# Patient Record
Sex: Male | Born: 1990 | Race: White | Hispanic: No | Marital: Single | Smoking: Never smoker
Health system: Southern US, Community
[De-identification: ages and names within clinical notes are randomized; demographics above are authoritative.]

## PROBLEM LIST (undated history)

## (undated) DIAGNOSIS — J302 Other seasonal allergic rhinitis: Secondary | ICD-10-CM

## (undated) DIAGNOSIS — F419 Anxiety disorder, unspecified: Secondary | ICD-10-CM

## (undated) HISTORY — DX: Anxiety disorder, unspecified: F41.9

---

## 2014-05-14 ENCOUNTER — Emergency Department (HOSPITAL_COMMUNITY): Payer: Worker's Compensation

## 2014-05-14 ENCOUNTER — Encounter (HOSPITAL_COMMUNITY): Payer: Self-pay | Admitting: Emergency Medicine

## 2014-05-14 ENCOUNTER — Emergency Department (HOSPITAL_COMMUNITY)
Admission: EM | Admit: 2014-05-14 | Discharge: 2014-05-14 | Disposition: A | Discharge status: Home / Self Care | Payer: Worker's Compensation | Attending: Emergency Medicine | Admitting: Emergency Medicine

## 2014-05-14 DIAGNOSIS — S82891A Other fracture of right lower leg, initial encounter for closed fracture: Secondary | ICD-10-CM

## 2014-05-14 DIAGNOSIS — Y9289 Other specified places as the place of occurrence of the external cause: Secondary | ICD-10-CM | POA: Insufficient documentation

## 2014-05-14 DIAGNOSIS — Z79899 Other long term (current) drug therapy: Secondary | ICD-10-CM | POA: Insufficient documentation

## 2014-05-14 DIAGNOSIS — Y9389 Activity, other specified: Secondary | ICD-10-CM | POA: Insufficient documentation

## 2014-05-14 DIAGNOSIS — S8263XA Displaced fracture of lateral malleolus of unspecified fibula, initial encounter for closed fracture: Secondary | ICD-10-CM | POA: Insufficient documentation

## 2014-05-14 MED ORDER — IBUPROFEN 200 MG PO TABS: 400.0000 mg | ORAL_TABLET | Freq: Four times a day (QID) | ORAL | Status: DC | PRN

## 2014-05-14 NOTE — ED Notes (Signed)
Pt was stepping out of truck, stepped onto R foot and twisted his ankle to the right. Pt complains of pain to lateral and anterior aspects of R ankle. Swelling and redness noted to site. Good dorsalis pedis pulse, good cap refill. Pt able to move all toes.

## 2014-05-14 NOTE — ED Provider Notes (Signed)
Medical screening examination/treatment/procedure(s) were performed by non-physician practitioner and as supervising physician I was immediately available for consultation/collaboration.  Nidya Bouyer L Steffani Dionisio, MD 05/14/14 2340 

## 2014-05-14 NOTE — ED Provider Notes (Signed)
CSN: [id number]     Arrival date & time 05/14/14  2014 History  This chart was scribed for non-physician practitioner, Earley Favor, NP working with Flint Melter, MD, by Jarvis Morgan, ED Scribe. This patient was seen in room WTR8/WTR8 and the patient's care was started at 8:44 PM.      Chief Complaint  Patient presents with  . Ankle Pain     The history is provided by the patient. No language interpreter was used.   HPI Comments: Omar Marquez is a 23 y.o. male who presents to the Emergency Department complaining of constant right ankle pain. He states he was stepping out of truck and twisted his ankle to the right. Pt states he is having associated swelling and redness of the right ankle. He states he applied ice to the area with relief. States that the pain initially was "9/10" but after applying ice compress it is now a "2/10". Has not taken any medication for the pain. He denies any numbness or weakness.      History reviewed. No pertinent past medical history. History reviewed. No pertinent past surgical history. No family history on file. History  Substance Use Topics  . Smoking status: Never Smoker   . Smokeless tobacco: Not on file  . Alcohol Use: 1.8 oz/week    3 Cans of beer per week    Review of Systems  Musculoskeletal: Positive for arthralgias (right ankle pain) and joint swelling (right ankle swelling).  Skin: Positive for color change (redness to right ankle). Negative for wound.  Neurological: Negative for weakness and numbness.  All other systems reviewed and are negative.     Allergies  Review of patient's allergies indicates no known allergies.  Home Medications   Prior to Admission medications   Medication Sig Start Date End Date Taking? Authorizing Provider  diphenhydrAMINE (BENADRYL) 25 MG tablet Take 50 mg by mouth every 6 (six) hours as needed for allergies or sleep.   Yes Historical Provider, MD  ibuprofen (ADVIL,MOTRIN) 200 MG tablet Take 2  tablets (400 mg total) by mouth every 6 (six) hours as needed for moderate pain. 05/14/14   Arman Filter, NP   Triage Vitals: BP 125/80  Pulse 91  Temp(Src) 98.4 F (36.9 C) (Oral)  Resp 18  Ht 6\' 1"  (1.854 m)  Wt 320 lb (145.151 kg)  BMI 42.23 kg/m2  SpO2 100%  Physical Exam  Nursing note and vitals reviewed. Constitutional: He is oriented to person, place, and time. He appears well-developed and well-nourished. No distress.  HENT:  Head: Normocephalic and atraumatic.  Eyes: Conjunctivae and EOM are normal.  Neck: Neck supple. No tracheal deviation present.  Cardiovascular: Normal rate.   Pulses:      Dorsalis pedis pulses are 2+ on the right side, and 2+ on the left side.  Pulmonary/Chest: Effort normal. No respiratory distress.  Musculoskeletal: Normal range of motion. He exhibits tenderness. He exhibits no edema.  Swelling of lateral malleous, good ROM of his toes, brisk cap refill  Neurological: He is alert and oriented to person, place, and time.  Skin: Skin is warm, dry and intact.  Psychiatric: He has a normal mood and affect. His behavior is normal.    ED Course  Procedures (including critical care time)  DIAGNOSTIC STUDIES: Oxygen Saturation is 100% on RA, normal by my interpretation.    COORDINATION OF CARE: 8:52 PM- Will order diagnostic imaging of right ankle. Pt advised of plan for treatment and pt agrees.  Labs Review Labs Reviewed - No data to display  Imaging Review Dg Ankle Complete Right  05/14/2014   CLINICAL DATA:  Lateral ankle pain, twisting injury  EXAM: RIGHT ANKLE - COMPLETE 3+ VIEW  COMPARISON:  None.  FINDINGS: Tiny cortical avulsion fracture along the inferior aspect of the lateral malleolus.  The ankle mortise is intact.  The base of the fifth metatarsal is unremarkable.  Mild lateral soft tissue swelling.  IMPRESSION: Tiny cortical avulsion fracture along the inferior aspect of the lateral malleolus.  Mild lateral soft tissue swelling.    Electronically Signed   By: Charline BillsSriyesh  Krishnan M.D.   On: 05/14/2014 21:30     EKG Interpretation None      MDM  Small avulsion fracture of the distal fibula  Placed in [facility] Walker and referred to ortho for FU  Final diagnoses:  Ankle fracture, right, closed, initial encounter   I personally performed the services described in this documentation, which was scribed in my presence. The recorded information has been reviewed and is accurate.     Arman FilterGail K Jayln Branscom, NP 05/14/14 2205

## 2014-05-14 NOTE — Discharge Instructions (Signed)
Ankle Fracture A fracture is a break in a bone. A cast or splint may be used to protect the ankle and heal the break. Sometimes, surgery is needed. HOME CARE  Use crutches as told by your doctor. It is very important that you use your crutches correctly.  Do not put weight or pressure on the injured ankle until told by your doctor.  Keep your ankle raised (elevated) when sitting or lying down.  Apply ice to the ankle:  Put ice in a plastic bag.  Place a towel between your cast and the bag.  Leave the ice on for 20 minutes, 2-3 times a day.  If you have a plaster or fiberglass cast:  Do not try to scratch under the cast with any objects.  Check the skin around the cast every day. You may put lotion on red or sore areas.  Keep your cast dry and clean.  If you have a plaster splint:  Wear the splint as told by your doctor.  You can loosen the elastic around the splint if your toes get numb, tingle, or turn cold or blue.  Do not put pressure on any part of your cast or splint. It may break. Rest your plaster splint or cast only on a pillow the first 24 hours until it is fully hardened.  Cover your cast or splint with a plastic bag during showers.  Do not lower your cast or splint into water.  Take medicine as told by your doctor.  Do not drive until your doctor says it is safe.  Follow-up with your doctor as told. It is very important that you go to your follow-up visits. GET HELP IF: The swelling and discomfort gets worse.  GET HELP RIGHT AWAY IF:   Your splint or cast breaks.  You continue to have very bad pain.  You have new pain or swelling after your splint or cast was put on.  Your skin or toes below the injured ankle:  Turn blue or gray.  Feel cold, numb, or you cannot feel them.  There is a bad smell or yellowish white fluid (pus) coming from under the splint or cast. MAKE SURE YOU:   Understand these instructions.  Will watch your  condition.  Will get help right away if you are not doing well or get worse. Document Released: 07/21/2009 Document Revised: 07/14/2013 Document Reviewed: 04/22/2013 [facility] Patient Information 2015 [employer] [place], [place]. This information is not intended to replace advice given to you by your health care provider. Make sure you discuss any questions you have with your health care provider. As shown you have a tiny avulsion fracture of the distal fibula  And have been placed in a walking cast  Please call Dr. Roda ShuttersXu to schedule an appointment for follow up care to monitor the healing

## 2015-10-05 IMAGING — CR DG ANKLE COMPLETE 3+V*R*
3 series · 3 of 3 positions shown · non-contrast
Comparison: None.

CLINICAL DATA: Lateral ankle pain, twisting injury

EXAM:
RIGHT ANKLE - COMPLETE 3+ VIEW

[x ankle ap right]
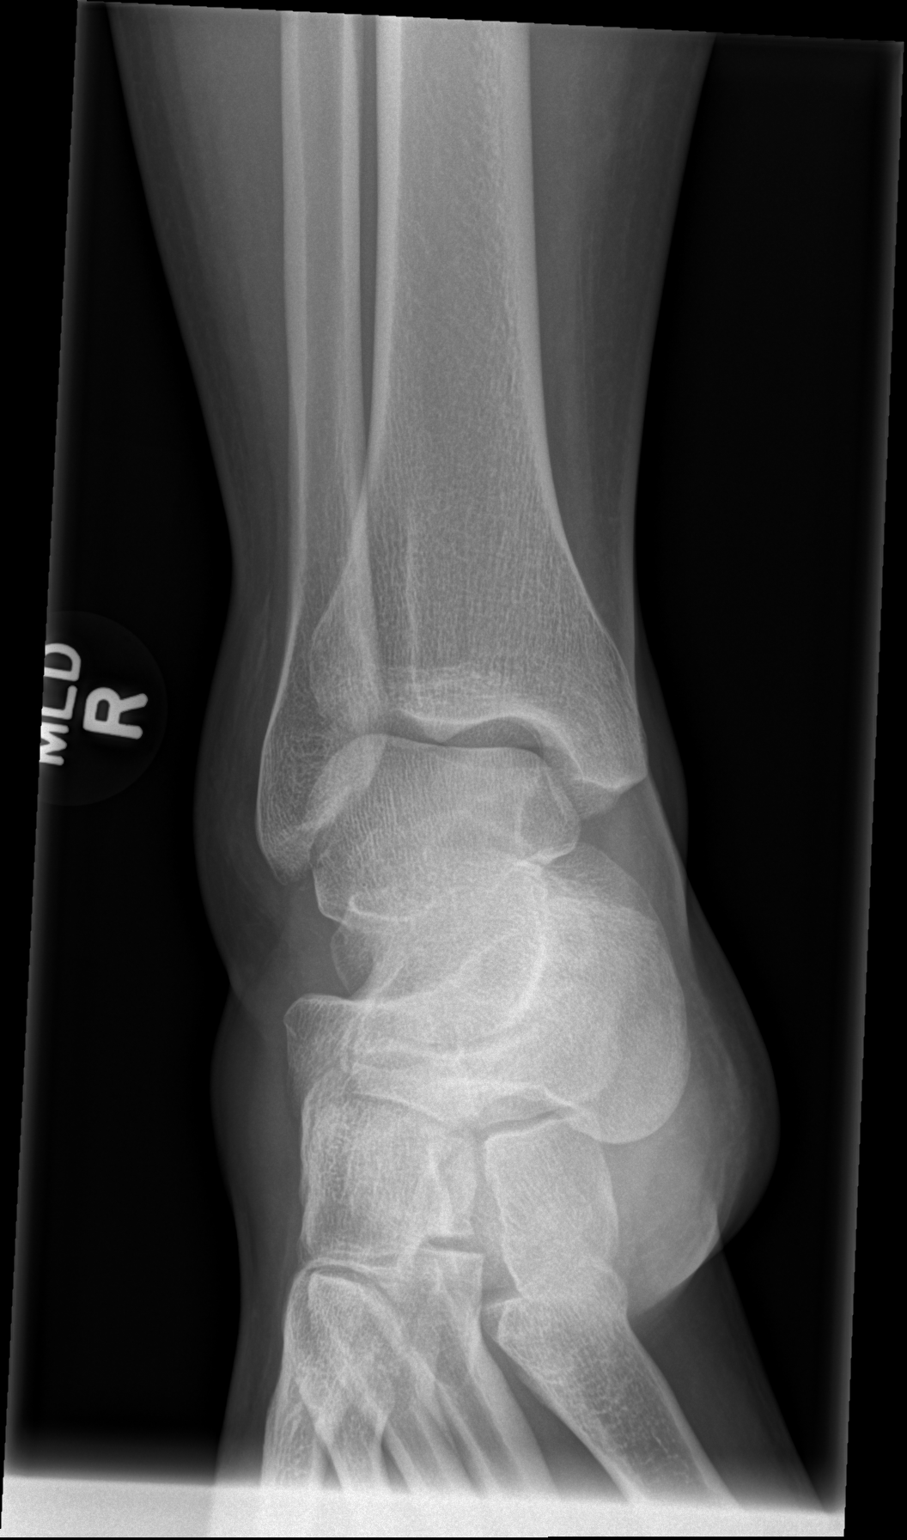

[x ankle obl right]
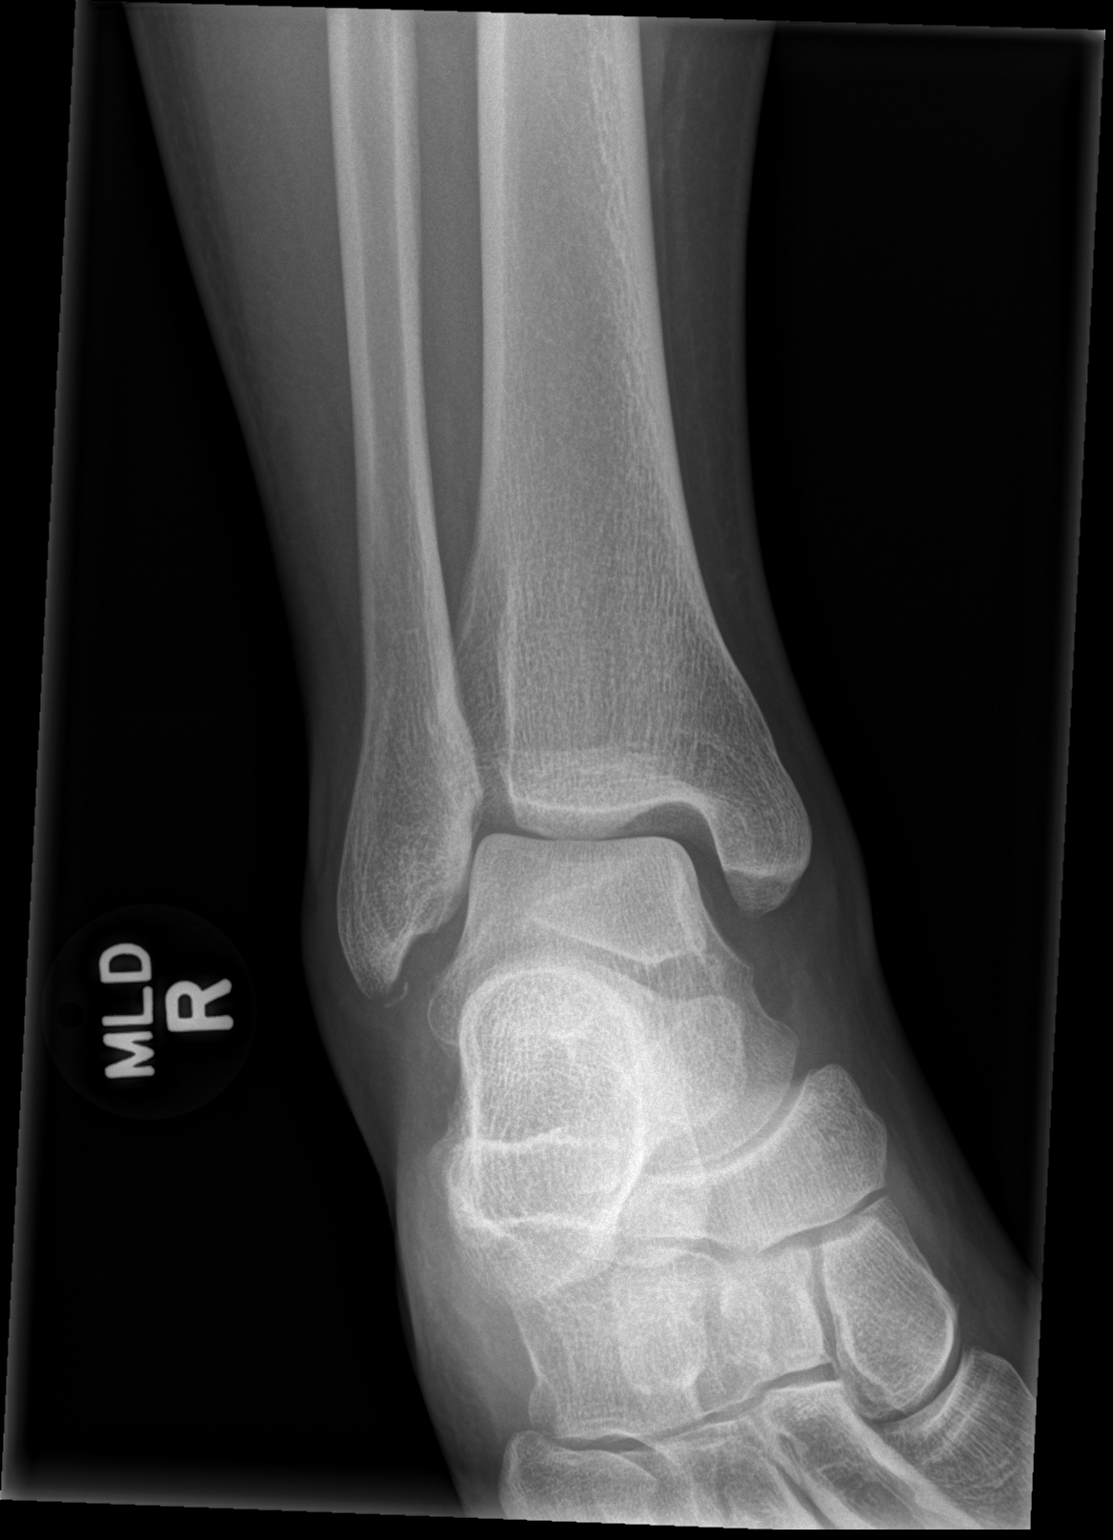

[x ankle lat right]
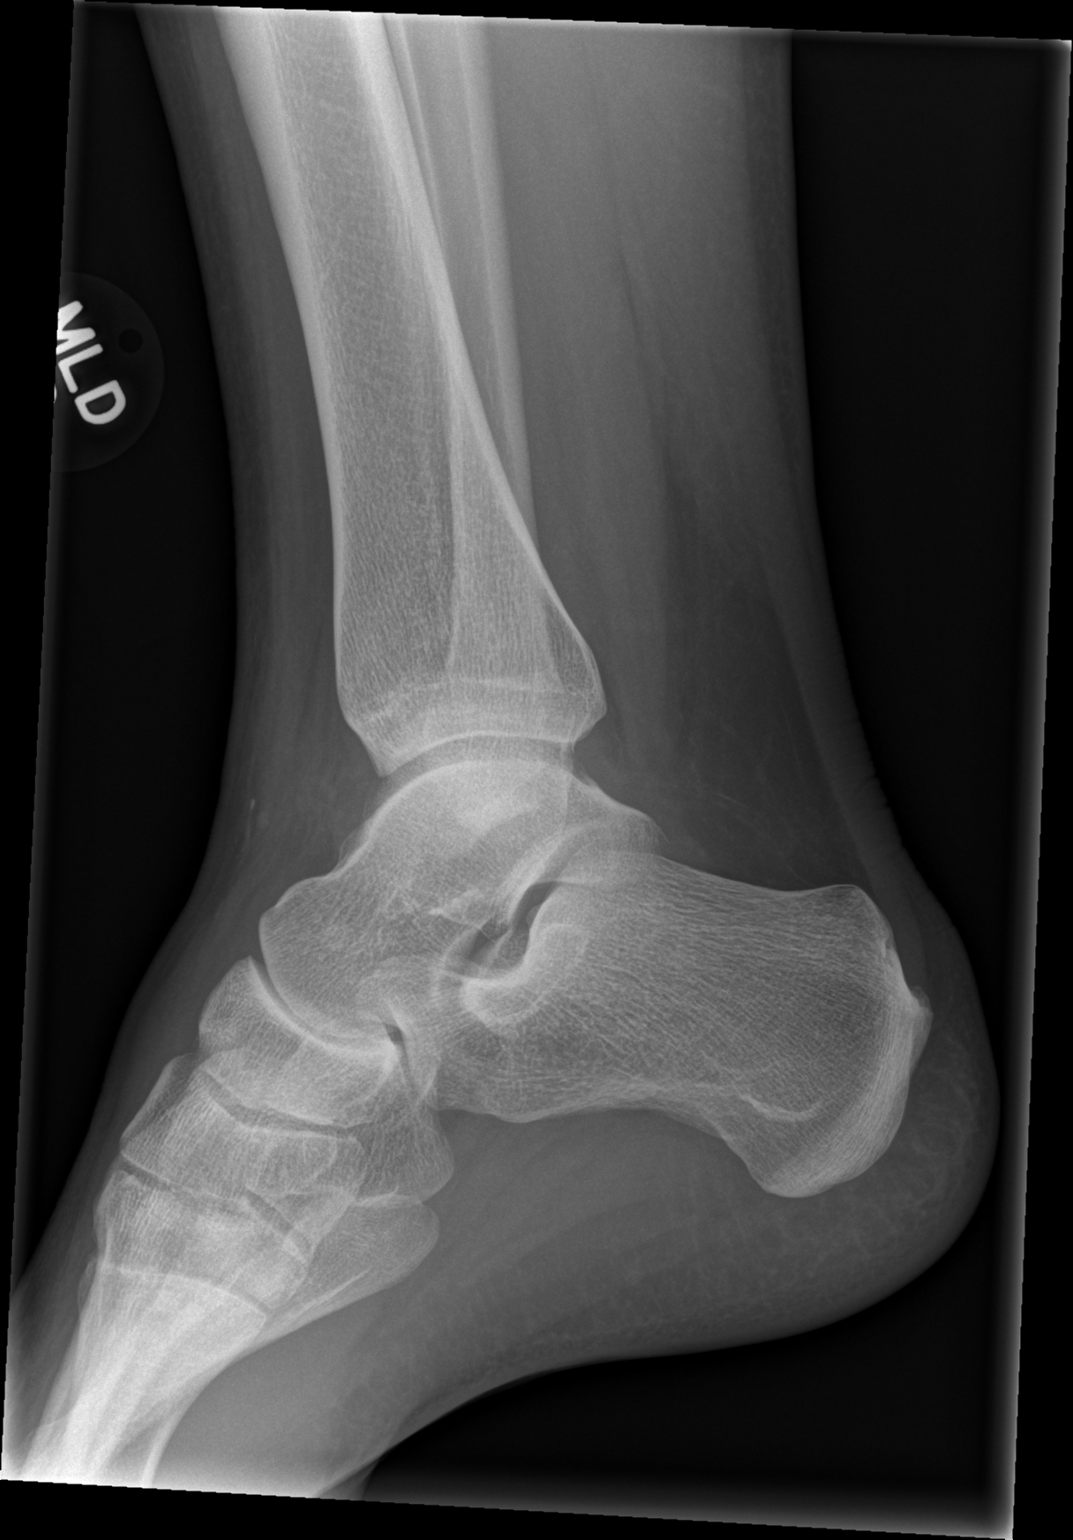

[3 of 3 positions shown; findings below may reference images not displayed]

FINDINGS: Tiny cortical avulsion fracture along the inferior aspect of the
lateral malleolus.

The ankle mortise is intact.

The base of the fifth metatarsal is unremarkable.

Mild lateral soft tissue swelling.
IMPRESSION: Tiny cortical avulsion fracture along the inferior aspect of the
lateral malleolus.

Mild lateral soft tissue swelling.

## 2022-01-31 ENCOUNTER — Emergency Department
Admission: RE | Admit: 2022-01-31 | Discharge: 2022-01-31 | Disposition: A | Discharge status: Home / Self Care | Payer: Self-pay | Source: Ambulatory Visit

## 2022-01-31 VITALS — BP 135/90 | HR 101 | Temp 99.0°F | Resp 20 | Ht 72.0 in | Wt 220.0 lb

## 2022-01-31 DIAGNOSIS — L0501 Pilonidal cyst with abscess: Secondary | ICD-10-CM

## 2022-01-31 HISTORY — DX: Other seasonal allergic rhinitis: J30.2

## 2022-01-31 MED ORDER — SULFAMETHOXAZOLE-TRIMETHOPRIM 800-160 MG PO TABS: 1.0000 | ORAL_TABLET | Freq: Two times a day (BID) | ORAL | 0 refills | Status: AC

## 2022-01-31 NOTE — ED Provider Notes (Signed)
?[facility] ? ? ? ?CSN: [id number] ?Arrival date & time: 01/31/22  1636 ? ? ?  ? ?History   ?Chief Complaint ?Chief Complaint  ?Patient presents with  ? Abscess  ? ? ?HPI ?Omar Marquez is a 31 y.o. male.  ? ?HPI 31 year old male presents with abscess of back of neck.  Patient reports lump is been there for several years but has become an inflamed and painful to the touch over the past several days.  PMH significant for seasonal allergies. ? ?Past Medical History:  ?Diagnosis Date  ? Seasonal allergies   ? ? ?There are no problems to display for this patient. ? ? ?History reviewed. No pertinent surgical history. ? ? ? ? ?Home Medications   ? ?Prior to Admission medications   ?Medication Sig Start Date End Date Taking? Authorizing Provider  ?cetirizine (ZYRTEC) 10 MG tablet Take 10 mg by mouth daily.   Yes [provider]  ?sulfamethoxazole-trimethoprim (BACTRIM DS) 800-160 MG tablet Take 1 tablet by mouth 2 (two) times daily for 10 days. 01/31/22 02/10/22 Yes Trevor Iha, FNP  ?diphenhydrAMINE (BENADRYL) 25 MG tablet Take 50 mg by mouth every 6 (six) hours as needed for allergies or sleep.    [provider]  ?ibuprofen (ADVIL,MOTRIN) 200 MG tablet Take 2 tablets (400 mg total) by mouth every 6 (six) hours as needed for moderate pain. 05/14/14   Earley Favor, NP  ? ? ?Family History ?Family History  ?Problem Relation Age of Onset  ? Hypertension Mother   ? Diabetes Mother   ? Hypertension Father   ? Diabetes Father   ? ? ?Social History ?Social History  ? ?Tobacco Use  ? Smoking status: Never  ? Smokeless tobacco: Never  ?Vaping Use  ? Vaping Use: Never used  ?Substance Use Topics  ? Alcohol use: Not Currently  ? Drug use: Yes  ?  Frequency: 5.0 times per week  ?  Types: Marijuana  ?  Comment: weekly  ? ? ? ?Allergies   ?Patient has no known allergies. ? ? ?Review of Systems ?Review of Systems  ?Skin:   ?     Cyst of back of neck x3 years  ?All other systems reviewed and are  negative. ? ? ?Physical Exam ?Triage Vital Signs ?ED Triage Vitals  ?Enc Vitals Group  ?   BP 01/31/22 1656 (!) 150/100  ?   Pulse Rate 01/31/22 1656 (!) 101  ?   Resp 01/31/22 1656 20  ?   Temp 01/31/22 1656 99 ?F (37.2 ?C)  ?   Temp Source 01/31/22 1656 Oral  ?   SpO2 01/31/22 1656 98 %  ?   Weight 01/31/22 1652 220 lb (99.8 kg)  ?   Height 01/31/22 1652 6' (1.829 m)  ?   Head Circumference --   ?   Peak Flow --   ?   Pain Score 01/31/22 1650 2  ?   Pain Loc --   ?   Pain Edu? --   ?   Excl. in GC? --   ? ?No data found. ? ?Updated Vital Signs ?BP 135/90 (BP Location: Right Arm)   Pulse (!) 101   Temp 99 ?F (37.2 ?C) (Oral)   Resp 20   Ht 6' (1.829 m)   Wt 220 lb (99.8 kg)   SpO2 98%   BMI 29.84 kg/m?  ? ?Physical Exam ?Vitals and nursing note reviewed.  ?Constitutional:   ?   Appearance: Normal appearance. He is  normal weight.  ?HENT:  ?   Head: Normocephalic and atraumatic.  ?   Mouth/Throat:  ?   Mouth: Mucous membranes are moist.  ?   Pharynx: Oropharynx is clear.  ?Eyes:  ?   Extraocular Movements: Extraocular movements intact.  ?   Conjunctiva/sclera: Conjunctivae normal.  ?   Pupils: Pupils are equal, round, and reactive to light.  ?Cardiovascular:  ?   Rate and Rhythm: Normal rate and regular rhythm.  ?   Pulses: Normal pulses.  ?   Heart sounds: Normal heart sounds. No murmur heard. ?Pulmonary:  ?   Effort: Pulmonary effort is normal.  ?   Breath sounds: Normal breath sounds. No wheezing, rhonchi or rales.  ?Musculoskeletal:  ?   Cervical back: Normal range of motion and neck supple.  ?Skin: ?   General: Skin is warm and dry.  ?   Comments: Posterior neck (right-sided inferior aspect):~3.5 cm x 3.5 cm with scant mucopurulent  ?Neurological:  ?   General: No focal deficit present.  ?   Mental Status: He is alert and oriented to person, place, and time. Mental status is at baseline.  ? ? ? ?UC Treatments / Results  ?Labs ?(all labs ordered are listed, but only abnormal results are displayed) ?Labs  Reviewed - No data to display ? ?EKG ? ? ?Radiology ?No results found. ? ?Procedures ?Incision and Drainage ? ?Date/Time: 01/31/2022 5:37 PM ?Performed by: Trevor Iha, FNP ?Authorized by: Trevor Iha, FNP  ? ?Consent:  ?  Consent obtained:  Verbal ?  Consent given by:  Patient ?  Risks, benefits, and alternatives were discussed: yes   ?Universal protocol:  ?  Procedure explained and questions answered to patient or proxy's satisfaction: yes   ?  Patient identity confirmed:  Verbally with patient, hospital-assigned identification number and arm band ?Location:  ?  Type:  Pilonidal cyst ?  Size:  3.5 x 3.5 cm ?  Location: Posterior neck. ?Pre-procedure details:  ?  Skin preparation:  Povidone-iodine ?Sedation:  ?  Sedation type:  None ?Anesthesia:  ?  Anesthesia method:  Local infiltration ?  Local anesthetic:  Lidocaine 2% w/o epi ?Procedure type:  ?  Complexity:  Simple ?Procedure details:  ?  Incision types:  Single straight ?  Incision depth:  Dermal ?  Drainage:  Serosanguinous ?  Drainage amount:  Moderate ?  Packing materials:  None ?Post-procedure details:  ?  Procedure completion:  Tolerated well, no immediate complications (including critical care time) ? ?Medications Ordered in UC ?Medications - No data to display ? ?Initial Impression / Assessment and Plan / UC Course  ?I have reviewed the triage vital signs and the nursing notes. ? ?Pertinent labs & imaging results that were available during my care of the patient were reviewed by me and considered in my medical decision making (see chart for details). ? ?  ? ?MDM: 1.  Pilonidal abscess-Rx'd Bactrim, please see procedural note. Advised/instructed patient to take medication as directed with food to completion.  Encouraged patient to increase daily water intake while taking this medication.  Advised patient if symptoms worsen and/or unresolved please follow-up with PCP or here for further evaluation.  Patient discharged home, hemodynamically  stable. ?Final Clinical Impressions(s) / UC Diagnoses  ? ?Final diagnoses:  ?Pilonidal abscess  ? ? ? ?Discharge Instructions   ? ?  ?Advised/instructed patient to take medication as directed with food to completion.  Encouraged patient to increase daily water intake while taking this medication.  Advised patient if symptoms worsen and/or unresolved please follow-up with PCP or here for further evaluation. ? ? ? ? ?ED Prescriptions   ? ? Medication Sig Dispense Auth. Provider  ? sulfamethoxazole-trimethoprim (BACTRIM DS) 800-160 MG tablet Take 1 tablet by mouth 2 (two) times daily for 10 days. 20 tablet Trevor Ihaagan, Omar Carmean, FNP  ? ?  ? ?PDMP not reviewed this encounter. ?  ?Trevor IhaRagan, Rylynn Kobs, FNP ?01/31/22 1742 ? ?

## 2022-01-31 NOTE — ED Triage Notes (Signed)
Pt presents to Urgent Care with c/o abscess to back of neck. Reports "lump" has been there for several years, but it has become inflamed and painful to the touch over the past several days.  ?

## 2022-01-31 NOTE — Discharge Instructions (Addendum)
Advised/instructed patient to take medication as directed with food to completion.  Encouraged patient to increase daily water intake while taking this medication.  Advised patient if symptoms worsen and/or unresolved please follow-up with PCP or here for further evaluation. ?

## 2022-02-11 ENCOUNTER — Emergency Department
Admission: RE | Admit: 2022-02-11 | Discharge: 2022-02-11 | Disposition: A | Discharge status: Home / Self Care | Payer: Self-pay | Source: Ambulatory Visit

## 2022-02-11 VITALS — BP 121/81 | HR 91 | Temp 98.3°F | Resp 18 | Ht 72.0 in | Wt 220.0 lb

## 2022-02-11 DIAGNOSIS — L089 Local infection of the skin and subcutaneous tissue, unspecified: Secondary | ICD-10-CM

## 2022-02-11 DIAGNOSIS — L0291 Cutaneous abscess, unspecified: Secondary | ICD-10-CM

## 2022-02-11 MED ORDER — AMOXICILLIN-POT CLAVULANATE 875-125 MG PO TABS: 1.0000 | ORAL_TABLET | Freq: Two times a day (BID) | ORAL | 0 refills | Status: DC

## 2022-02-11 NOTE — Discharge Instructions (Signed)
Put warm compresses on area for 20 min 2-3 times a day ?Take antibiotic as directed ?May change bandage daily, but try to protect gauze wick ?Come back in 3 days for re check ?Ibuprofen for pain ?

## 2022-02-11 NOTE — ED Triage Notes (Signed)
Patient c/o recurrent abscess at the base of his neck x 3-4 days ago.  The abscess was originally opened and drained 10 days ago.  Patient completed all medication given. ?

## 2022-02-14 ENCOUNTER — Emergency Department (INDEPENDENT_AMBULATORY_CARE_PROVIDER_SITE_OTHER)
Admission: RE | Admit: 2022-02-14 | Discharge: 2022-02-14 | Disposition: A | Discharge status: Home / Self Care | Payer: Self-pay | Source: Ambulatory Visit

## 2022-02-14 VITALS — BP 124/85 | HR 83 | Temp 99.1°F | Resp 17 | Wt 220.0 lb

## 2022-02-14 DIAGNOSIS — Z5189 Encounter for other specified aftercare: Secondary | ICD-10-CM

## 2022-02-14 NOTE — ED Provider Notes (Signed)
?[facility] ? ? ? ?CSN: [id number] ?Arrival date & time: 02/14/22  1543 ? ? ?  ? ?History   ?Chief Complaint ?Chief Complaint  ?Patient presents with  ? Wound Check  ? ? ?HPI ?Omar Marquez is a 31 y.o. male.  ? ?HPI 31 year old male presents for follow-up of infected epithelial inclusion cyst from Monday, 02/11/2022 office visit.  Patient reports packing was placed in affected inclusion cyst area of her neck on 02/11/2022.  No documentation can be found about packing this area, although can be seen is new prescription for antibiotic Augmentin. ? ?Past Medical History:  ?Diagnosis Date  ? Seasonal allergies   ? ? ?There are no problems to display for this patient. ? ? ?History reviewed. No pertinent surgical history. ? ? ? ? ?Home Medications   ? ?Prior to Admission medications   ?Medication Sig Start Date End Date Taking? Authorizing Provider  ?amoxicillin-clavulanate (AUGMENTIN) 875-125 MG tablet Take 1 tablet by mouth every 12 (twelve) hours. 02/11/22  Yes Eustace Moore, MD  ?cetirizine (ZYRTEC) 10 MG tablet Take 10 mg by mouth daily.    [provider]  ?diphenhydrAMINE (BENADRYL) 25 MG tablet Take 50 mg by mouth every 6 (six) hours as needed for allergies or sleep.    [provider]  ?ibuprofen (ADVIL,MOTRIN) 200 MG tablet Take 2 tablets (400 mg total) by mouth every 6 (six) hours as needed for moderate pain. 05/14/14   Earley Favor, NP  ? ? ?Family History ?Family History  ?Problem Relation Age of Onset  ? Hypertension Mother   ? Diabetes Mother   ? Hypertension Father   ? Diabetes Father   ? ? ?Social History ?Social History  ? ?Tobacco Use  ? Smoking status: Never  ? Smokeless tobacco: Never  ?Vaping Use  ? Vaping Use: Never used  ?Substance Use Topics  ? Alcohol use: Not Currently  ? Drug use: Yes  ?  Frequency: 5.0 times per week  ?  Types: Marijuana  ?  Comment: weekly  ? ? ? ?Allergies   ?Patient has no known allergies. ? ? ?Review of Systems ?Review of Systems  ?Skin:   ?      S/P infected epithelial inclusion cyst follow-up  ? ? ?Physical Exam ?Triage Vital Signs ?ED Triage Vitals  ?Enc Vitals Group  ?   BP   ?   Pulse   ?   Resp   ?   Temp   ?   Temp src   ?   SpO2   ?   Weight   ?   Height   ?   Head Circumference   ?   Peak Flow   ?   Pain Score   ?   Pain Loc   ?   Pain Edu?   ?   Excl. in GC?   ? ?No data found. ? ?Updated Vital Signs ?BP 124/85 (BP Location: Left Arm)   Pulse 83   Temp 99.1 ?F (37.3 ?C) (Oral)   Resp 17   Wt 220 lb (99.8 kg)   SpO2 100%   BMI 29.84 kg/m?  ? ?Physical Exam ?Vitals and nursing note reviewed.  ?Constitutional:   ?   Appearance: Normal appearance. He is normal weight.  ?HENT:  ?   Head: Normocephalic and atraumatic.  ?Eyes:  ?   Extraocular Movements: Extraocular movements intact.  ?   Conjunctiva/sclera: Conjunctivae normal.  ?   Pupils: Pupils are equal, round, and reactive  to light.  ?Cardiovascular:  ?   Rate and Rhythm: Normal rate and regular rhythm.  ?   Pulses: Normal pulses.  ?   Heart sounds: Normal heart sounds.  ?Pulmonary:  ?   Effort: Pulmonary effort is normal.  ?   Breath sounds: Normal breath sounds. No wheezing, rhonchi or rales.  ?Musculoskeletal:  ?   Cervical back: Normal range of motion and neck supple.  ?Skin: ?   General: Skin is warm and dry.  ?   Comments: Posterior neck (right-sided inferior aspect):~12.0-15.0 cm Iodofoam saturated with bloody mucopurulent discharge removed successfully, affected area is non-erythematous, non-indurated, non-fluctuant, no lymphatic streaking noted  ?Neurological:  ?   General: No focal deficit present.  ?   Mental Status: He is alert and oriented to person, place, and time.  ? ? ? ?UC Treatments / Results  ?Labs ?(all labs ordered are listed, but only abnormal results are displayed) ?Labs Reviewed - No data to display ? ?EKG ? ? ?Radiology ?No results found. ? ?Procedures ?Procedures (including critical care time) ? ?Medications Ordered in UC ?Medications - No data to display ? ?Initial  Impression / Assessment and Plan / UC Course  ?I have reviewed the triage vital signs and the nursing notes. ? ?Pertinent labs & imaging results that were available during my care of the patient were reviewed by me and considered in my medical decision making (see chart for details). ? ?  ? ?MDM: 1.  Wound check, abscess-Advised patient to leave area open to heal by secondary intention/allow scab formation.  Encouraged patient to not apply any topical antibiotics to this area and to complete previously prescribed Augmentin.  Patient discharged home, hemodynamically stable. ?Final Clinical Impressions(s) / UC Diagnoses  ? ?Final diagnoses:  ?Wound check, abscess  ? ? ? ?Discharge Instructions   ? ?  ?Advised patient to leave area open to heal by secondary intention/allow scab formation.  Encouraged patient to not apply any topical antibiotics to this area and to complete previously prescribed Augmentin. ? ? ? ? ?ED Prescriptions   ?None ?  ? ?PDMP not reviewed this encounter. ?  ?Trevor Iha, FNP ?02/14/22 1618 ? ?

## 2022-02-14 NOTE — ED Triage Notes (Signed)
Recheck of wound & removal of packing to right side of neck ?Packing in place  ?Denies fever ?Taking Advil 400mg  at 0830 ?Taking antibiotics as directed   ?No red ness at site - bandaid in place ?

## 2022-02-14 NOTE — Discharge Instructions (Addendum)
Advised patient to leave area open to heal by secondary intention/allow scab formation.  Encouraged patient to not apply any topical antibiotics to this area and to complete previously prescribed Augmentin. ?

## 2023-08-12 ENCOUNTER — Ambulatory Visit: Payer: Self-pay | Admitting: Family Medicine

## 2023-08-12 ENCOUNTER — Ambulatory Visit: Payer: Self-pay | Admitting: Urgent Care

## 2023-08-12 VITALS — BP 128/79 | HR 114 | Temp 98.1°F | Ht 71.0 in | Wt 233.0 lb

## 2023-08-12 DIAGNOSIS — Z23 Encounter for immunization: Secondary | ICD-10-CM | POA: Insufficient documentation

## 2023-08-12 DIAGNOSIS — R000 Tachycardia, unspecified: Secondary | ICD-10-CM | POA: Insufficient documentation

## 2023-08-12 DIAGNOSIS — F410 Panic disorder [episodic paroxysmal anxiety] without agoraphobia: Secondary | ICD-10-CM | POA: Insufficient documentation

## 2023-08-12 DIAGNOSIS — Z6832 Body mass index (BMI) 32.0-32.9, adult: Secondary | ICD-10-CM | POA: Insufficient documentation

## 2023-08-12 DIAGNOSIS — Z7689 Persons encountering health services in other specified circumstances: Secondary | ICD-10-CM | POA: Insufficient documentation

## 2023-08-12 MED ORDER — HYDROXYZINE PAMOATE 25 MG PO CAPS: 25.0000 mg | ORAL_CAPSULE | Freq: Three times a day (TID) | ORAL | 0 refills | Status: AC | PRN

## 2023-08-12 NOTE — Assessment & Plan Note (Addendum)
Unsure what is causing symptoms. Will get labs to rule out thyroid issues. He will try hydroxyzine 25 mg every 8 hours as needed for symptoms while waiting for lab results. Understands this can cause drowsiness and will not drive. Denies chest pain and palpitations.  Endorsees shortness of breath during episodes.

## 2023-08-12 NOTE — Assessment & Plan Note (Signed)
If thyroid function is normal, patient is interested in starting on medication for anxiety.  He is experiencing some stressful situation with his current employer.  Symptoms have occurred 3 times.  No thoughts of self-harm.  Will discuss behavioral health referral as needed.

## 2023-08-12 NOTE — Progress Notes (Signed)
New Patient Office Visit  Subjective    Patient ID: Omar Marquez, male    DOB: 26-Dec-1990  Age: 32 y.o. MRN: [id number]  CC:  Chief Complaint  Patient presents with   Establish Care    Mood anxiety 1 mo painic attack blurry vision cold clammy sweating, and jittery, sallow breathing.     HPI Omar Marquez presents to establish care with this practice. He is new to me.   Reports having blurred vision, cold clammy skin, sweating, and jitteriness, difficult getting warm, and labored breathing.  Occurred 3 times. First one month ago.  Tries deep breathing which helps a little. Talking to mom helps. Work is stressful right now. Thinking about his job is causing him to feel jittery now.  Diabetes runs in family. Mom tested his blood sugar and it was normal.  Unsure if anxiety is causing symptoms, will get labs today.      Outpatient Encounter Medications as of 08/12/2023  Medication Sig   hydrOXYzine (VISTARIL) 25 MG capsule Take 1 capsule (25 mg total) by mouth every 8 (eight) hours as needed.   ibuprofen (ADVIL,MOTRIN) 200 MG tablet Take 2 tablets (400 mg total) by mouth every 6 (six) hours as needed for moderate pain.   [DISCONTINUED] amoxicillin-clavulanate (AUGMENTIN) 875-125 MG tablet Take 1 tablet by mouth every 12 (twelve) hours. (Patient not taking: Reported on 08/12/2023)   [DISCONTINUED] cetirizine (ZYRTEC) 10 MG tablet Take 10 mg by mouth daily. (Patient not taking: Reported on 08/12/2023)   [DISCONTINUED] diphenhydrAMINE (BENADRYL) 25 MG tablet Take 50 mg by mouth every 6 (six) hours as needed for allergies or sleep. (Patient not taking: Reported on 08/12/2023)   No facility-administered encounter medications on file as of 08/12/2023.    Past Medical History:  Diagnosis Date   Anxiety A month ago   Establishing care with new doctor, encounter for 08/12/2023   Seasonal allergies     History reviewed. No pertinent surgical history.  Family History  Problem  Relation Age of Onset   Hypertension Mother    Diabetes Mother    Hypertension Father    Diabetes Father     Social History   Socioeconomic History   Marital status: Single    Spouse name: Not on file   Number of children: Not on file   Years of education: Not on file   Highest education level: Bachelor's degree (e.g., BA, AB, BS)  Occupational History   Not on file  Tobacco Use   Smoking status: Never   Smokeless tobacco: Never  Vaping Use   Vaping status: Never Used  Substance and Sexual Activity   Alcohol use: Not Currently   Drug use: Yes    Frequency: 5.0 times per week    Types: Marijuana    Comment: weekly   Sexual activity: Not Currently    Birth control/protection: None  Other Topics Concern   Not on file  Social History Narrative   Not on file   Social Determinants of Health   Financial Resource Strain: Low Risk  (08/12/2023)   Overall Financial Resource Strain (CARDIA)    Difficulty of Paying Living Expenses: Not very hard  Food Insecurity: No Food Insecurity (08/12/2023)   Hunger Vital Sign    Worried About Running Out of Food in the Last Year: Never true    Ran Out of Food in the Last Year: Never true  Transportation Needs: No Transportation Needs (08/12/2023)   PRAPARE - Transportation    Lack of Transportation (  Medical): No    Lack of Transportation (Non-Medical): No  Physical Activity: Insufficiently Active (08/12/2023)   Exercise Vital Sign    Days of Exercise per Week: 3 days    Minutes of Exercise per Session: 30 min  Stress: Stress Concern Present (08/12/2023)   [employer] of Occupational Health - Occupational Stress Questionnaire    Feeling of Stress : To some extent  Social Connections: Socially Isolated (08/12/2023)   Social Connection and Isolation Panel [NHANES]    Frequency of Communication with Friends and Family: More than three times a week    Frequency of Social Gatherings with Friends and Family: Three times a week    Attends  Religious Services: Never    Active Member of Clubs or Organizations: No    Attends Engineer, structural: Not on file    Marital Status: Never married  Intimate Partner Violence: Not on file    Review of Systems  Constitutional:  Positive for chills. Negative for fever and weight loss.  Respiratory:  Positive for shortness of breath (during episode).   Cardiovascular:  Negative for chest pain and palpitations.  Neurological:  Positive for dizziness.  Endo/Heme/Allergies:  Negative for polydipsia.  Psychiatric/Behavioral:  Negative for depression and suicidal ideas.         Objective    BP 128/79   Pulse (!) 114   Temp 98.1 F (36.7 C) (Oral)   Ht 5\' 11"  (1.803 m)   Wt 233 lb (105.7 kg)   SpO2 99%   BMI 32.50 kg/m   Physical Exam Vitals and nursing note reviewed.  Constitutional:      General: He is not in acute distress.    Appearance: Normal appearance.  Cardiovascular:     Rate and Rhythm: Normal rate.  Pulmonary:     Effort: Pulmonary effort is normal.  Skin:    General: Skin is warm and dry.  Neurological:     General: No focal deficit present.     Mental Status: He is alert. Mental status is at baseline.  Psychiatric:        Mood and Affect: Mood normal.        Behavior: Behavior normal.        Thought Content: Thought content normal.        Judgment: Judgment normal.        Assessment & Plan:   Problem List Items Addressed This Visit     Establishing care with new doctor, encounter for - Primary   Tachycardia, unspecified    Unsure what is causing symptoms. Will get labs to rule out thyroid issues. He will try hydroxyzine 25 mg every 8 hours as needed for symptoms while waiting for lab results. Understands this can cause drowsiness and will not drive. Denies chest pain and palpitations.  Endorsees shortness of breath during episodes.        Relevant Medications   hydrOXYzine (VISTARIL) 25 MG capsule   Other Relevant Orders   CBC with  Differential/Platelet   Comprehensive metabolic panel   TSH + free T4   BMI 32.0-32.9,adult   Relevant Orders   Hemoglobin A1c   Panic attack    If thyroid function is normal, patient is interested in starting on medication for anxiety.  He is experiencing some stressful situation with his current employer.  Symptoms have occurred 3 times.  No thoughts of self-harm.  Will discuss behavioral health referral as needed.      Relevant Medications   hydrOXYzine (VISTARIL)  25 MG capsule  Agrees with plan of care discussed.  Questions answered.   Return in about 4 weeks (around 09/09/2023) for CPE with labs.   Novella Olive, FNP

## 2023-08-13 ENCOUNTER — Other Ambulatory Visit: Payer: Self-pay | Admitting: Family Medicine

## 2023-08-13 ENCOUNTER — Encounter: Payer: Self-pay | Admitting: Family Medicine

## 2023-08-13 DIAGNOSIS — F411 Generalized anxiety disorder: Secondary | ICD-10-CM | POA: Insufficient documentation

## 2023-08-13 LAB — CBC WITH DIFFERENTIAL/PLATELET
Basophils Absolute: 0 10*3/uL (ref 0.0–0.2)
Basos: 1 %
EOS (ABSOLUTE): 0.2 10*3/uL (ref 0.0–0.4)
Eos: 4 %
Hematocrit: 49.7 % (ref 37.5–51.0)
Hemoglobin: 16.7 g/dL (ref 13.0–17.7)
Immature Grans (Abs): 0 10*3/uL (ref 0.0–0.1)
Immature Granulocytes: 0 %
Lymphocytes Absolute: 1.7 10*3/uL (ref 0.7–3.1)
Lymphs: 31 %
MCH: 31.3 pg (ref 26.6–33.0)
MCHC: 33.6 g/dL (ref 31.5–35.7)
MCV: 93 fL (ref 79–97)
Monocytes Absolute: 0.3 10*3/uL (ref 0.1–0.9)
Monocytes: 6 %
Neutrophils Absolute: 3.1 10*3/uL (ref 1.4–7.0)
Neutrophils: 58 %
Platelets: 268 10*3/uL (ref 150–450)
RBC: 5.33 x10E6/uL (ref 4.14–5.80)
RDW: 11.3 % — ABNORMAL LOW (ref 11.6–15.4)
WBC: 5.4 10*3/uL (ref 3.4–10.8)

## 2023-08-13 LAB — COMPREHENSIVE METABOLIC PANEL
ALT: 27 [IU]/L (ref 0–44)
AST: 19 [IU]/L (ref 0–40)
Albumin: 4.8 g/dL (ref 4.1–5.1)
Alkaline Phosphatase: 88 [IU]/L (ref 44–121)
BUN/Creatinine Ratio: 8 — ABNORMAL LOW (ref 9–20)
BUN: 10 mg/dL (ref 6–20)
Bilirubin Total: 2 mg/dL — ABNORMAL HIGH (ref 0.0–1.2)
CO2: 22 mmol/L (ref 20–29)
Calcium: 9.9 mg/dL (ref 8.7–10.2)
Chloride: 104 mmol/L (ref 96–106)
Creatinine, Ser: 1.2 mg/dL (ref 0.76–1.27)
Globulin, Total: 2.1 g/dL (ref 1.5–4.5)
Glucose: 91 mg/dL (ref 70–99)
Potassium: 4.1 mmol/L (ref 3.5–5.2)
Sodium: 142 mmol/L (ref 134–144)
Total Protein: 6.9 g/dL (ref 6.0–8.5)
eGFR: 83 mL/min/{1.73_m2} (ref >59)

## 2023-08-13 LAB — TSH+FREE T4
Free T4: 1.7 ng/dL (ref 0.82–1.77)
TSH: 1.78 u[IU]/mL (ref 0.450–4.500)

## 2023-08-13 LAB — HEMOGLOBIN A1C
Est. average glucose Bld gHb Est-mCnc: 108 mg/dL
Hgb A1c MFr Bld: 5.4 % (ref 4.8–5.6)

## 2023-08-13 MED ORDER — ESCITALOPRAM OXALATE 10 MG PO TABS: 10.0000 mg | ORAL_TABLET | Freq: Every day | ORAL | 1 refills | Status: DC

## 2023-09-09 ENCOUNTER — Encounter: Payer: Self-pay | Admitting: Family Medicine

## 2023-09-09 ENCOUNTER — Ambulatory Visit: Payer: Self-pay | Admitting: Family Medicine

## 2023-09-09 VITALS — BP 133/76 | HR 76 | Temp 98.2°F | Resp 18 | Ht 71.0 in | Wt 234.5 lb

## 2023-09-09 DIAGNOSIS — L720 Epidermal cyst: Secondary | ICD-10-CM | POA: Insufficient documentation

## 2023-09-09 DIAGNOSIS — Z23 Encounter for immunization: Secondary | ICD-10-CM

## 2023-09-09 DIAGNOSIS — Z0000 Encounter for general adult medical examination without abnormal findings: Secondary | ICD-10-CM

## 2023-09-09 DIAGNOSIS — Z136 Encounter for screening for cardiovascular disorders: Secondary | ICD-10-CM

## 2023-09-09 DIAGNOSIS — Z13220 Encounter for screening for lipoid disorders: Secondary | ICD-10-CM

## 2023-09-09 NOTE — Progress Notes (Signed)
Complete physical exam  Patient: Omar Marquez   DOB: 1991/03/11   32 y.o. Male  MRN: [id number]  Subjective:    Chief Complaint  Patient presents with   Annual Exam    Ruslan Brotman is a 32 y.o. male who presents today for a complete physical exam. He reports consuming a general diet.  Walks   He generally feels well. He reports sleeping well. He does have additional problems to discuss today.   Did not start Lexapro that was prescribed. Symptoms have improved. Has used hydroxyzine several times, not in past 2.5 weeks.   Cyst on back of neck on right. Has been drained in the past and has recurred. Will place referral to derm for definitive treatment.   Need for vaccines: Influenza T dap Covid    Most recent fall risk assessment:    08/12/2023    9:19 AM  Fall Risk   Falls in the past year? 0  Number falls in past yr: 0  Injury with Fall? 0     Most recent depression screenings:    08/12/2023    9:14 AM  PHQ 2/9 Scores  PHQ - 2 Score 1  PHQ- 9 Score 3    Vision:Within last year and Dental: No current dental problems and No regular dental care   Past Medical History:  Diagnosis Date   Anxiety A month ago   Establishing care with new doctor, encounter for 08/12/2023   Seasonal allergies       Patient Care Team: Novella Olive, FNP as PCP - General (Family Medicine)   Outpatient Medications Prior to Visit  Medication Sig Note   hydrOXYzine (VISTARIL) 25 MG capsule Take 1 capsule (25 mg total) by mouth every 8 (eight) hours as needed.    [DISCONTINUED] escitalopram (LEXAPRO) 10 MG tablet Take 1 tablet (10 mg total) by mouth daily. (Patient not taking: Reported on 09/09/2023) 09/09/2023: did not start medication   No facility-administered medications prior to visit.    Review of Systems  Skin:        Recurring cyst on back of neck.   Psychiatric/Behavioral:  Negative for depression and suicidal ideas. The patient is not nervous/anxious.            Objective:     BP 133/76 (BP Location: Right Arm, Patient Position: Sitting, Cuff Size: Normal)   Pulse 76   Temp 98.2 F (36.8 C)   Resp 18   Ht 5\' 11"  (1.803 m)   Wt 234 lb 8 oz (106.4 kg)   SpO2 98%   BMI 32.71 kg/m  BP Readings from Last 3 Encounters:  09/09/23 133/76  08/12/23 128/79  02/14/22 124/85      Physical Exam Vitals and nursing note reviewed.  Constitutional:      General: He is not in acute distress.    Appearance: Normal appearance.  HENT:     Right Ear: Tympanic membrane normal.     Left Ear: Tympanic membrane normal.     Nose: Nose normal.     Mouth/Throat:     Mouth: Mucous membranes are moist.     Pharynx: Oropharynx is clear.  Eyes:     Extraocular Movements: Extraocular movements intact.  Neck:     Thyroid: No thyroid tenderness.     Comments: 3 cm x 3 cm firm cyst on back of right neck. Cardiovascular:     Rate and Rhythm: Normal rate and regular rhythm.     Pulses:  Radial pulses are 2+ on the right side and 2+ on the left side.     Heart sounds: Normal heart sounds, S1 normal and S2 normal.  Pulmonary:     Effort: Pulmonary effort is normal.     Breath sounds: Normal breath sounds.  Abdominal:     General: Bowel sounds are normal.     Palpations: Abdomen is soft.     Tenderness: There is no abdominal tenderness.  Musculoskeletal:        General: Normal range of motion.     Cervical back: Normal range of motion.     Right lower leg: No edema.     Left lower leg: No edema.  Lymphadenopathy:     Cervical:     Right cervical: No superficial cervical adenopathy.    Left cervical: No superficial cervical adenopathy.  Skin:    General: Skin is warm and dry.  Neurological:     General: No focal deficit present.     Mental Status: He is alert. Mental status is at baseline.  Psychiatric:        Mood and Affect: Mood normal.        Behavior: Behavior normal.        Thought Content: Thought content normal.        Judgment:  Judgment normal.      No results found for any visits on 09/09/23. Last CBC Lab Results  Component Value Date   WBC 5.4 08/12/2023   HGB 16.7 08/12/2023   HCT 49.7 08/12/2023   MCV 93 08/12/2023   MCH 31.3 08/12/2023   RDW 11.3 (L) 08/12/2023   PLT 268 08/12/2023   Last metabolic panel Lab Results  Component Value Date   GLUCOSE 91 08/12/2023   NA 142 08/12/2023   K 4.1 08/12/2023   CL 104 08/12/2023   CO2 22 08/12/2023   BUN 10 08/12/2023   CREATININE 1.20 08/12/2023   EGFR 83 08/12/2023   CALCIUM 9.9 08/12/2023   PROT 6.9 08/12/2023   ALBUMIN 4.8 08/12/2023   LABGLOB 2.1 08/12/2023   BILITOT 2.0 (H) 08/12/2023   ALKPHOS 88 08/12/2023   AST 19 08/12/2023   ALT 27 08/12/2023   Last hemoglobin A1c Lab Results  Component Value Date   HGBA1C 5.4 08/12/2023   Last thyroid functions Lab Results  Component Value Date   TSH 1.780 08/12/2023   Needs lipid panel today.       Assessment & Plan:    Routine Health Maintenance and Physical Exam  Immunization History  Administered Date(s) Administered   Influenza, Seasonal, Injecte, Preservative Fre 09/09/2023   PFIZER Comirnaty(Gray Top)Covid-19 Tri-Sucrose Vaccine 09/09/2023   PPD Test 11/01/2015   Tdap 09/09/2023    Health Maintenance  Topic Date Due   Hepatitis C Screening  09/08/2024 (Originally 08/21/2009)   HIV Screening  09/08/2024 (Originally 08/21/2006)   COVID-19 Vaccine (2 - 2023-24 season) 11/04/2023   DTaP/Tdap/Td (2 - Td or Tdap) 09/08/2033   INFLUENZA VACCINE  Completed   HPV VACCINES  Aged Out    Discussed health benefits of physical activity, and encouraged him to engage in regular exercise appropriate for his age and condition.  Annual physical exam  Encounter for lipid screening for cardiovascular disease -     Lipid panel  Epidermoid cyst of neck -     Ambulatory referral to Dermatology  Encounter for administration of vaccine -     Flu vaccine trivalent PF, 6mos and  older(Flulaval,Afluria,Fluarix,Fluzone) -  PFIZER Comirnaty(GRAY TOP)COVID-19 Vaccine -     Tdap vaccine greater than or equal to 7yo IM      Routine labs ordered. Lipid panel needed today.  HCM reviewed/discussed. Declines Hep C and HIV. Agrees to T dap, influenza, and Covid vaccines. Anticipatory guidance regarding healthy weight, lifestyle and choices given. Recommend healthy diet.  Recommend approximately 150 minutes/week of moderate intensity exercise. Resistance training is good for building muscles and for bone health. Muscle mass helps to increase our metabolism and to burn more calories at rest.  Limit alcohol consumption: no more than one drink per day for women and 2 drinks per day for me. Recommend regular dental and vision exams. Always use seatbelt/lap and shoulder restraints. Recommend using smoke alarms and checking batteries at least twice a year. Recommend using sunscreen when outside.  Please know that I am here to help you with all of your health care goals and am happy to work with you to find a solution that works best for you.  The greatest advice I have received with any changes in life are to take it one step at a time, that even means if all you can focus on is the next 60 seconds, then do that and celebrate your victories.  With any changes in life, you will have set backs, and that is OK. The important thing to remember is, if you have a set back, it is not a failure, it is an opportunity to try again! Agrees with plan of care discussed.  Questions answered.      Return in about 1 year (around 09/08/2024) for CPE with labs.     Novella Olive, FNP

## 2023-09-10 LAB — LIPID PANEL
Chol/HDL Ratio: 3.8 {ratio} (ref 0.0–5.0)
Cholesterol, Total: 135 mg/dL (ref 100–199)
HDL: 36 mg/dL — ABNORMAL LOW (ref >39)
LDL Chol Calc (NIH): 85 mg/dL (ref 0–99)
Triglycerides: 71 mg/dL (ref 0–149)
VLDL Cholesterol Cal: 14 mg/dL (ref 5–40)

## 2023-09-17 ENCOUNTER — Ambulatory Visit
Admission: EM | Admit: 2023-09-17 | Discharge: 2023-09-17 | Disposition: A | Discharge status: Home / Self Care | Payer: Self-pay | Attending: Emergency Medicine | Admitting: Emergency Medicine

## 2023-09-17 ENCOUNTER — Encounter: Payer: Self-pay | Admitting: Emergency Medicine

## 2023-09-17 DIAGNOSIS — L720 Epidermal cyst: Secondary | ICD-10-CM

## 2023-09-17 DIAGNOSIS — L089 Local infection of the skin and subcutaneous tissue, unspecified: Secondary | ICD-10-CM

## 2023-09-17 MED ORDER — DOXYCYCLINE HYCLATE 100 MG PO TABS: 100.0000 mg | ORAL_TABLET | Freq: Two times a day (BID) | ORAL | 0 refills | Status: AC

## 2023-09-17 MED ORDER — CETIRIZINE HCL 10 MG PO TABS: 10.0000 mg | ORAL_TABLET | Freq: Every day | ORAL | 1 refills | Status: AC

## 2023-09-17 MED ORDER — TRIAMCINOLONE ACETONIDE 0.025 % EX CREA: 1.0000 | TOPICAL_CREAM | Freq: Two times a day (BID) | CUTANEOUS | 0 refills | Status: AC

## 2023-09-17 NOTE — ED Provider Notes (Signed)
[facility]    CSN: [id number] Arrival date & time: 09/17/23  9629    HISTORY   Chief Complaint  Patient presents with   Abscess   HPI Joan Sahni is a pleasant, 32 y.o. male who presents to urgent care today. Patient complains of recurrent infection of known inclusion cyst at the base of the right side of his neck.  Patient states he was on his way to work and felt it pop and drain in-transit, states he has been attempting to squeeze it to remove more pus without meaningful relief of tenderness and swelling.  Patient states he has been referred to a dermatologist to have the cyst but has not yet set up an appointment.  EMR reviewed by me, patient also has a history of pilonidal cyst.  Patient states the cyst has been increasing in size, redness and tenderness for the past week or so.  Patient states he has been trying to ignore it but feels that because it is located where the collar of his shirt usually is, it gets more irritated than another inclusion cyst that he says he has lower down on his back.  The history is provided by the patient.   Past Medical History:  Diagnosis Date   Anxiety A month ago   Establishing care with new doctor, encounter for 08/12/2023   Seasonal allergies    Patient Active Problem List   Diagnosis Date Noted   Epidermoid cyst of neck 09/09/2023   GAD (generalized anxiety disorder) 08/13/2023   Encounter for administration of vaccine 08/12/2023   Tachycardia, unspecified 08/12/2023   BMI 32.0-32.9,adult 08/12/2023   Panic attack 08/12/2023   History reviewed. No pertinent surgical history.  Home Medications    Prior to Admission medications   Medication Sig Start Date End Date Taking? Authorizing Provider  cetirizine (ZYRTEC ALLERGY) 10 MG tablet Take 1 tablet (10 mg total) by mouth at bedtime. 09/17/23 03/15/24 Yes Theadora Rama Scales, PA-C  doxycycline (VIBRA-TABS) 100 MG tablet Take 1 tablet (100 mg total) by mouth 2 (two) times  daily for 10 days. 09/17/23 09/27/23 Yes Theadora Rama Scales, PA-C  triamcinolone (KENALOG) 0.025 % cream Apply 1 Application topically 2 (two) times daily. Apply to affected area(s) on face, neck and base of scalp twice daily for 7 days or until lesions have resolved, whichever comes first. 09/17/23  Yes Theadora Rama Scales, PA-C  hydrOXYzine (VISTARIL) 25 MG capsule Take 1 capsule (25 mg total) by mouth every 8 (eight) hours as needed. 08/12/23   Novella Olive, FNP    Family History Family History  Problem Relation Age of Onset   Hypertension Mother    Diabetes Mother    Hypertension Father    Diabetes Father    Social History Social History   Tobacco Use   Smoking status: Never   Smokeless tobacco: Never  Vaping Use   Vaping status: Never Used  Substance Use Topics   Alcohol use: Not Currently   Drug use: Yes    Frequency: 5.0 times per week    Types: Marijuana    Comment: weekly   Allergies   Patient has no known allergies.  Review of Systems Review of Systems Pertinent findings revealed after performing a 14 point review of systems has been noted in the history of present illness.  Physical Exam Vital Signs BP (!) 144/94 (BP Location: Right Arm)   Pulse 87   Temp 98.5 F (36.9 C) (Oral)   Resp 17  SpO2 98%   No data found.  Physical Exam Vitals and nursing note reviewed.  Constitutional:      General: He is not in acute distress.    Appearance: Normal appearance. He is normal weight. He is not ill-appearing.  HENT:     Head: Normocephalic and atraumatic.  Eyes:     Extraocular Movements: Extraocular movements intact.     Conjunctiva/sclera: Conjunctivae normal.     Pupils: Pupils are equal, round, and reactive to light.  Cardiovascular:     Rate and Rhythm: Normal rate and regular rhythm.  Pulmonary:     Effort: Pulmonary effort is normal.     Breath sounds: Normal breath sounds.  Musculoskeletal:        General: Normal range of motion.      Cervical back: Normal range of motion and neck supple.  Skin:    General: Skin is warm and dry.     Findings: Lesion (Please see photo below) present.  Neurological:     General: No focal deficit present.     Mental Status: He is alert and oriented to person, place, and time. Mental status is at baseline.  Psychiatric:        Mood and Affect: Mood normal.        Behavior: Behavior normal.        Thought Content: Thought content normal.        Judgment: Judgment normal.      Visual Acuity Right Eye Distance:   Left Eye Distance:   Bilateral Distance:    Right Eye Near:   Left Eye Near:    Bilateral Near:     UC Couse / Diagnostics / Procedures:     Radiology No results found.  Procedures Procedures (including critical care time) EKG  Pending results:  Labs Reviewed - No data to display  Medications Ordered in UC: Medications - No data to display  UC Diagnoses / Final Clinical Impressions(s)   I have reviewed the triage vital signs and the nursing notes.  Pertinent labs & imaging results that were available during my care of the patient were reviewed by me and considered in my medical decision making (see chart for details).    Final diagnoses:  Infected epithelial inclusion cyst   Patient provided with a 10-day course of doxycycline do not only address possible infection of the area but also to help calm the skin.  Patient advised to begin Zyrtec as well, also to calm the skin.  Patient provided with a topical steroid for the same reason.  Patient encouraged to schedule an appointment with dermatology as soon as possible.  Conservative care recommended.  Return precautions advised.  Please see discharge instructions below for details of plan of care as provided to patient. ED Prescriptions     Medication Sig Dispense Auth. Provider   doxycycline (VIBRA-TABS) 100 MG tablet Take 1 tablet (100 mg total) by mouth 2 (two) times daily for 10 days. 20 tablet Theadora Rama Scales, PA-C   cetirizine (ZYRTEC ALLERGY) 10 MG tablet Take 1 tablet (10 mg total) by mouth at bedtime. 90 tablet Theadora Rama Scales, PA-C   triamcinolone (KENALOG) 0.025 % cream Apply 1 Application topically 2 (two) times daily. Apply to affected area(s) on face, neck and base of scalp twice daily for 7 days or until lesions have resolved, whichever comes first. 80 g Theadora Rama Scales, PA-C      PDMP not reviewed this encounter.  Pending results:  Labs  Reviewed - No data to display    Discharge Instructions      I have enclosed information about epidermoid cyst that I hope you find helpful.  I sent a prescription for doxycycline to your pharmacy, please take 1 tablet twice daily for the next 10 days.  Doxycycline is an antibiotic that not only covers MRSA, a particularly aggressive form of staph bacteria, but also has an anti-inflammatory properties that calm skin down.  Doxycycline particularly useful for treating inflammatory skin conditions such as epidermoid cysts, rosacea and acne.  I also recommend that you take Zyrtec daily as this will be another way to help calm your skin.  Zyrtec works best when taken on a regular basis, not "as needed".  It takes 5 or 6 days for Zyrtec to be fully effective in your body which is why consistent dosing is recommended.  Finally, I have provided you with a topical steroid called triamcinolone that you can apply to not only the epidermoid cyst to help calm it down but also to that area of redness at the base of your scalp that I showed you in the picture I took for your chart.  This is a very mild dose steroid and can be applied twice daily for the next 7 days.  After that, please discontinue use.  You should have enough left over should you have another similar cyst or inflammatory skin reaction that you can apply early and hopefully head it off at the pass, so to speak.  Please be sure that you reach out to Mr. Heywood Iles office  ASAP to for further evaluation.  Mr. Madaline Savage is a physician assistant, like me, so hopefully it will not take as long to get an appointment with him as it would to get an appointment with an MD.  Thank you for visiting [facility] today.  We appreciate the opportunity to participate in your care.    Disposition Upon Discharge:  Condition: stable for discharge home  Patient presented with an acute illness with associated systemic symptoms and significant discomfort requiring urgent management. In my opinion, this is a condition that a prudent lay person (someone who possesses an average knowledge of health and medicine) may potentially expect to result in complications if not addressed urgently such as respiratory distress, impairment of bodily function or dysfunction of bodily organs.   Routine symptom specific, illness specific and/or disease specific instructions were discussed with the patient and/or caregiver at length.   As such, the patient has been evaluated and assessed, work-up was performed and treatment was provided in alignment with urgent care protocols and evidence based medicine.  Patient/parent/caregiver has been advised that the patient may require follow up for further testing and treatment if the symptoms continue in spite of treatment, as clinically indicated and appropriate.  Patient/parent/caregiver has been advised to return to the [facility] or PCP if no better; to PCP or the Emergency Department if new signs and symptoms develop, or if the current signs or symptoms continue to change or worsen for further workup, evaluation and treatment as clinically indicated and appropriate  The patient will follow up with their current PCP if and as advised. If the patient does not currently have a PCP we will assist them in obtaining one.   The patient may need specialty follow up if the symptoms continue, in spite of conservative treatment and management, for further workup,  evaluation, consultation and treatment as clinically indicated and appropriate.  Patient/parent/caregiver verbalized  understanding and agreement of plan as discussed.  All questions were addressed during visit.  Please see discharge instructions below for further details of plan.  This office note has been dictated using Teaching laboratory technician.  Unfortunately, this method of dictation can sometimes lead to typographical or grammatical errors.  I apologize for your inconvenience in advance if this occurs.  Please do not hesitate to reach out to me if clarification is needed.      Theadora Rama Scales, PA-C 09/17/23 1009

## 2023-09-17 NOTE — ED Triage Notes (Signed)
Pt presents with abscess on back of neck. States he was on way to work and felt it pop this morning. Does have referral for dermatologist

## 2023-09-17 NOTE — Discharge Instructions (Signed)
I have enclosed information about epidermoid cyst that I hope you find helpful.  I sent a prescription for doxycycline to your pharmacy, please take 1 tablet twice daily for the next 10 days.  Doxycycline is an antibiotic that not only covers MRSA, a particularly aggressive form of staph bacteria, but also has an anti-inflammatory properties that calm skin down.  Doxycycline particularly useful for treating inflammatory skin conditions such as epidermoid cysts, rosacea and acne.  I also recommend that you take Zyrtec daily as this will be another way to help calm your skin.  Zyrtec works best when taken on a regular basis, not "as needed".  It takes 5 or 6 days for Zyrtec to be fully effective in your body which is why consistent dosing is recommended.  Finally, I have provided you with a topical steroid called triamcinolone that you can apply to not only the epidermoid cyst to help calm it down but also to that area of redness at the base of your scalp that I showed you in the picture I took for your chart.  This is a very mild dose steroid and can be applied twice daily for the next 7 days.  After that, please discontinue use.  You should have enough left over should you have another similar cyst or inflammatory skin reaction that you can apply early and hopefully head it off at the pass, so to speak.  Please be sure that you reach out to Mr. Heywood Iles office ASAP to for further evaluation.  Mr. Madaline Savage is a physician assistant, like me, so hopefully it will not take as long to get an appointment with him as it would to get an appointment with an MD.  Thank you for visiting [facility] today.  We appreciate the opportunity to participate in your care.
